# Patient Record
Sex: Male | Born: 1995 | Race: White | Hispanic: No | Marital: Single
Health system: Southern US, Community
[De-identification: ages and names within clinical notes are randomized; demographics above are authoritative.]

---
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
## 2018-12-21 DIAGNOSIS — N62 Hypertrophy of breast: Secondary | ICD-10-CM | POA: Diagnosis not present

## 2018-12-21 DIAGNOSIS — N61 Mastitis without abscess: Secondary | ICD-10-CM | POA: Diagnosis not present

## 2018-12-23 ENCOUNTER — Other Ambulatory Visit: Payer: Self-pay | Admitting: Family Medicine

## 2018-12-23 DIAGNOSIS — N644 Mastodynia: Secondary | ICD-10-CM

## 2018-12-23 DIAGNOSIS — N62 Hypertrophy of breast: Secondary | ICD-10-CM

## 2019-01-05 ENCOUNTER — Other Ambulatory Visit: Payer: Self-pay

## 2019-01-05 ENCOUNTER — Ambulatory Visit
Admission: RE | Admit: 2019-01-05 | Discharge: 2019-01-05 | Disposition: A | Discharge status: Home / Self Care | Payer: BC Managed Care – PPO | Source: Ambulatory Visit | Attending: Family Medicine | Admitting: Family Medicine

## 2019-01-05 DIAGNOSIS — N644 Mastodynia: Secondary | ICD-10-CM | POA: Diagnosis not present

## 2019-01-05 DIAGNOSIS — N62 Hypertrophy of breast: Secondary | ICD-10-CM

## 2019-04-15 ENCOUNTER — Ambulatory Visit: Payer: BC Managed Care – PPO | Attending: Internal Medicine

## 2019-04-15 DIAGNOSIS — Z23 Encounter for immunization: Secondary | ICD-10-CM

## 2019-04-15 NOTE — Progress Notes (Signed)
   Covid-19 Vaccination Clinic  Name:  Lawrence Silva    MRN: [id number] DOB: 09/20/1995  04/15/2019  Lawrence Silva was observed post Covid-19 immunization for 15 minutes without incident. He was provided with Vaccine Information Sheet and instruction to access the V-Safe system.   Lawrence Silva was instructed to call 911 with any severe reactions post vaccine: Marland Kitchen Difficulty breathing  . Swelling of face and throat  . A fast heartbeat  . A bad rash all over body  . Dizziness and weakness

## 2019-05-11 ENCOUNTER — Ambulatory Visit: Payer: BC Managed Care – PPO | Attending: Internal Medicine

## 2019-05-11 DIAGNOSIS — Z23 Encounter for immunization: Secondary | ICD-10-CM

## 2019-05-11 NOTE — Progress Notes (Signed)
   Covid-19 Vaccination Clinic  Name:  ERMINIO NYGARD    MRN: [id number] DOB: August 16, 1995  05/11/2019  Mr. Trombetta was observed post Covid-19 immunization for 15 minutes without incident. He was provided with Vaccine Information Sheet and instruction to access the V-Safe system.   Mr. Alegria was instructed to call 911 with any severe reactions post vaccine: Marland Kitchen Difficulty breathing  . Swelling of face and throat  . A fast heartbeat  . A bad rash all over body  . Dizziness and weakness   Immunizations Administered    Name Date Dose VIS Date Route   Pfizer COVID-19 Vaccine 05/11/2019  1:55 PM 0.3 mL 01/21/2019 Intramuscular   Manufacturer: [employer], [employer]   Lot: [id number]   NDC: [address]-9

## 2020-12-28 IMAGING — US US BREAST*R* LIMITED INC AXILLA
1 series · 2 of 2 positions shown · non-contrast
Comparison: Previous exam(s).

CLINICAL DATA: Patient presents with enlargement and pain of the
right breast.

EXAM:
ULTRASOUND OF THE RIGHT BREAST

[Series 1: us breast*right* limited inc axilla · 0.06mm/px · 2 of 2 slices shown]
[im 1/2]
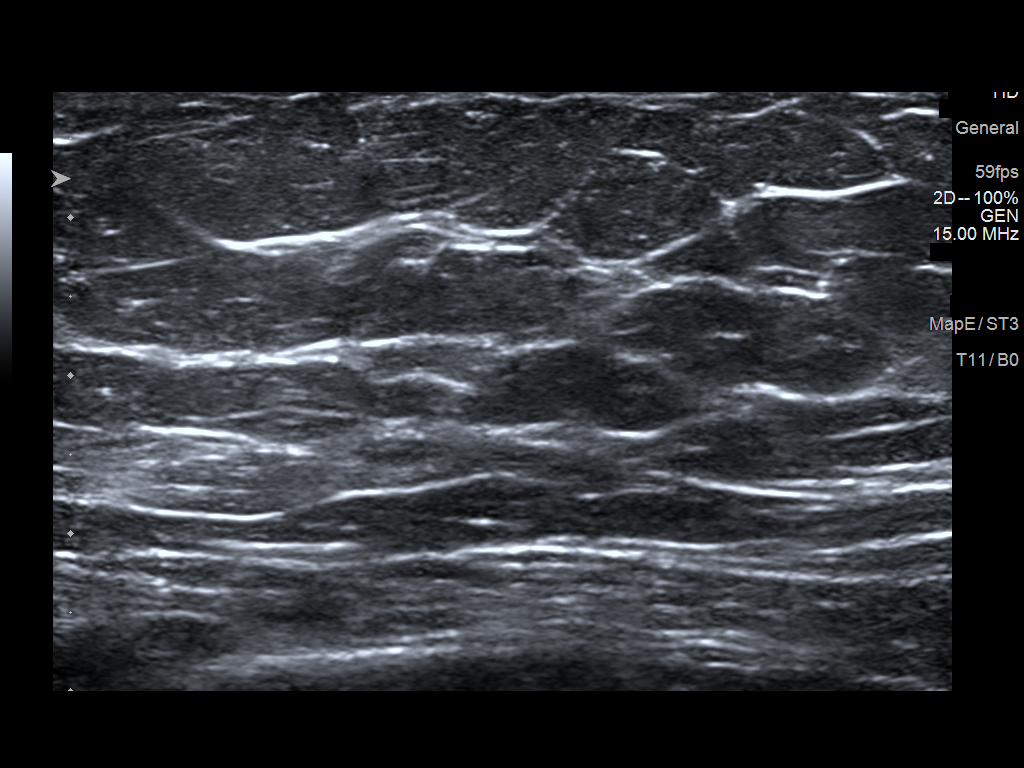
[im 2/2]
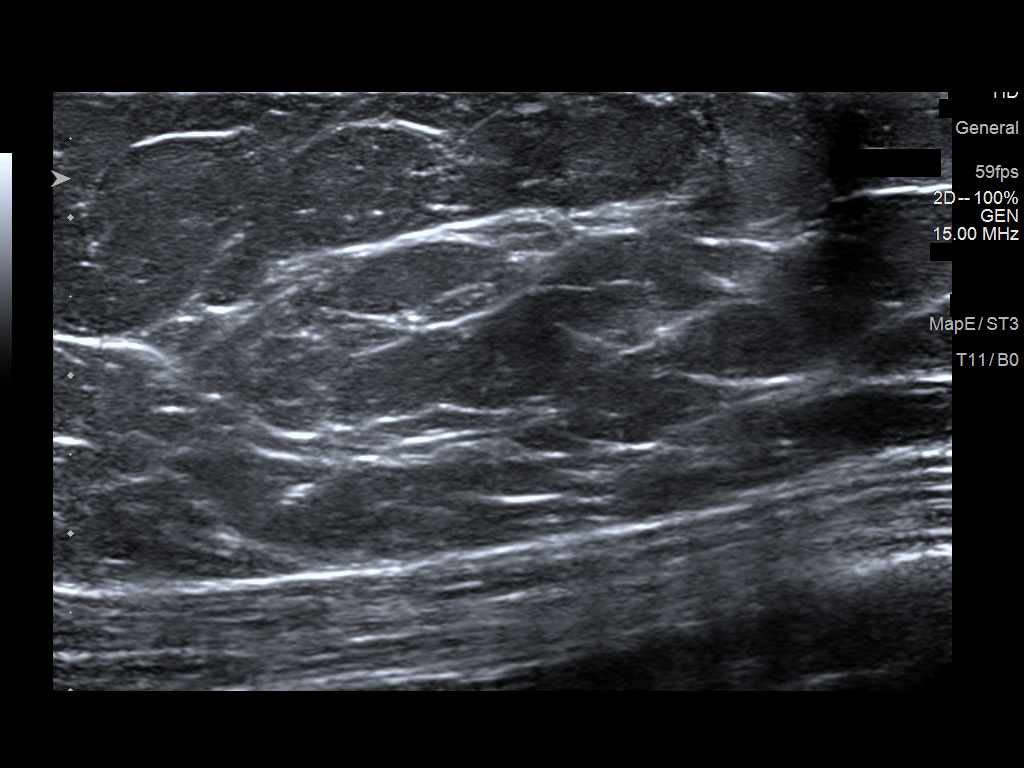

[2 of 2 positions shown; findings below may reference images not displayed]

FINDINGS: On physical exam, there is mild asymmetric enlargement of the right
breast compared to the left. No erythema or skin changes. I do not
palpate a discrete fixed mass.

Patient reports the enlargement and tenderness have improved
recently.

Targeted ultrasound is performed at the palpable site of concern in
the right breast at 10 o'clock 1 cm from the nipple demonstrating
fat density tissue. No suspicious cystic or solid mass identified.
IMPRESSION: No sonographic evidence of malignancy or other finding to explain
the asymmetric enlargement of the right breast, possibly due to
asymmetric accumulation of fat. No definite evidence of infection or
trauma. Patient reports recent improvement in the enlargement and
tenderness.

RECOMMENDATION:
Clinical follow-up as needed for the right breast.

I have discussed the findings and recommendations with the patient.
If applicable, a reminder letter will be sent to the patient
regarding the next appointment.

BI-RADS CATEGORY  2: Benign.
